# Patient Record
Sex: Female | Born: 1970
Health system: Southern US, Community
[De-identification: ages and names within clinical notes are randomized; demographics above are authoritative.]

## PROBLEM LIST (undated history)

## (undated) DIAGNOSIS — F32A Depression, unspecified: Secondary | ICD-10-CM

## (undated) DIAGNOSIS — F419 Anxiety disorder, unspecified: Secondary | ICD-10-CM

## (undated) HISTORY — DX: Depression, unspecified: F32.A

## (undated) HISTORY — DX: Anxiety disorder, unspecified: F41.9

## (undated) HISTORY — PX: COLONOSCOPY: SHX174

---

## 1997-04-10 HISTORY — PX: OTHER SURGICAL HISTORY: SHX169

## 2001-01-04 ENCOUNTER — Emergency Department (HOSPITAL_COMMUNITY): Admission: EM | Admit: 2001-01-04 | Discharge: 2001-01-04 | Payer: Self-pay | Admitting: Emergency Medicine

## 2008-04-10 HISTORY — PX: BREAST ENHANCEMENT SURGERY: SHX7

## 2019-03-13 DIAGNOSIS — F4320 Adjustment disorder, unspecified: Secondary | ICD-10-CM | POA: Diagnosis not present

## 2019-03-27 DIAGNOSIS — F4320 Adjustment disorder, unspecified: Secondary | ICD-10-CM | POA: Diagnosis not present

## 2019-05-27 DIAGNOSIS — F331 Major depressive disorder, recurrent, moderate: Secondary | ICD-10-CM | POA: Diagnosis not present

## 2019-05-27 DIAGNOSIS — R41840 Attention and concentration deficit: Secondary | ICD-10-CM | POA: Diagnosis not present

## 2019-05-27 DIAGNOSIS — Z800 Family history of malignant neoplasm of digestive organs: Secondary | ICD-10-CM | POA: Diagnosis not present

## 2019-05-27 DIAGNOSIS — F419 Anxiety disorder, unspecified: Secondary | ICD-10-CM | POA: Diagnosis not present

## 2019-06-04 DIAGNOSIS — F909 Attention-deficit hyperactivity disorder, unspecified type: Secondary | ICD-10-CM | POA: Diagnosis not present

## 2019-07-07 DIAGNOSIS — F909 Attention-deficit hyperactivity disorder, unspecified type: Secondary | ICD-10-CM | POA: Diagnosis not present

## 2019-07-10 DIAGNOSIS — F4320 Adjustment disorder, unspecified: Secondary | ICD-10-CM | POA: Diagnosis not present

## 2020-04-15 DIAGNOSIS — F909 Attention-deficit hyperactivity disorder, unspecified type: Secondary | ICD-10-CM | POA: Diagnosis not present

## 2020-05-12 DIAGNOSIS — Z800 Family history of malignant neoplasm of digestive organs: Secondary | ICD-10-CM | POA: Diagnosis not present

## 2020-05-12 DIAGNOSIS — B009 Herpesviral infection, unspecified: Secondary | ICD-10-CM | POA: Diagnosis not present

## 2020-05-12 DIAGNOSIS — Z0000 Encounter for general adult medical examination without abnormal findings: Secondary | ICD-10-CM | POA: Diagnosis not present

## 2020-05-12 DIAGNOSIS — Z8249 Family history of ischemic heart disease and other diseases of the circulatory system: Secondary | ICD-10-CM | POA: Diagnosis not present

## 2020-07-05 DIAGNOSIS — F909 Attention-deficit hyperactivity disorder, unspecified type: Secondary | ICD-10-CM | POA: Diagnosis not present

## 2020-07-20 DIAGNOSIS — Z13220 Encounter for screening for lipoid disorders: Secondary | ICD-10-CM | POA: Diagnosis not present

## 2020-07-20 DIAGNOSIS — F900 Attention-deficit hyperactivity disorder, predominantly inattentive type: Secondary | ICD-10-CM | POA: Diagnosis not present

## 2020-07-20 DIAGNOSIS — Z0000 Encounter for general adult medical examination without abnormal findings: Secondary | ICD-10-CM | POA: Diagnosis not present

## 2020-08-26 DIAGNOSIS — Z20822 Contact with and (suspected) exposure to covid-19: Secondary | ICD-10-CM | POA: Diagnosis not present

## 2020-09-16 ENCOUNTER — Emergency Department (HOSPITAL_COMMUNITY): Payer: BC Managed Care – PPO

## 2020-09-16 ENCOUNTER — Emergency Department (HOSPITAL_COMMUNITY)
Admission: EM | Admit: 2020-09-16 | Discharge: 2020-09-17 | Disposition: A | Discharge status: Home / Self Care | Payer: BC Managed Care – PPO | Attending: Emergency Medicine | Admitting: Emergency Medicine

## 2020-09-16 ENCOUNTER — Other Ambulatory Visit: Payer: Self-pay

## 2020-09-16 ENCOUNTER — Encounter (HOSPITAL_COMMUNITY): Payer: Self-pay | Admitting: Emergency Medicine

## 2020-09-16 DIAGNOSIS — Z8616 Personal history of COVID-19: Secondary | ICD-10-CM | POA: Insufficient documentation

## 2020-09-16 DIAGNOSIS — R079 Chest pain, unspecified: Secondary | ICD-10-CM | POA: Diagnosis not present

## 2020-09-16 DIAGNOSIS — R072 Precordial pain: Secondary | ICD-10-CM | POA: Diagnosis not present

## 2020-09-16 DIAGNOSIS — R091 Pleurisy: Secondary | ICD-10-CM

## 2020-09-16 LAB — BASIC METABOLIC PANEL
Anion gap: 7 (ref 5–15)
BUN: 13 mg/dL (ref 6–20)
CO2: 27 mmol/L (ref 22–32)
Calcium: 9 mg/dL (ref 8.9–10.3)
Chloride: 102 mmol/L (ref 98–111)
Creatinine, Ser: 0.65 mg/dL (ref 0.44–1.00)
GFR, Estimated: >60 mL/min (ref >60)
Glucose, Bld: 115 mg/dL — ABNORMAL HIGH (ref 70–99)
Potassium: 3.8 mmol/L (ref 3.5–5.1)
Sodium: 136 mmol/L (ref 135–145)

## 2020-09-16 LAB — CBC
HCT: 37.8 % (ref 36.0–46.0)
Hemoglobin: 12.7 g/dL (ref 12.0–15.0)
MCH: 33.1 pg (ref 26.0–34.0)
MCHC: 33.6 g/dL (ref 30.0–36.0)
MCV: 98.4 fL (ref 80.0–100.0)
Platelets: 205 10*3/uL (ref 150–400)
RBC: 3.84 MIL/uL — ABNORMAL LOW (ref 3.87–5.11)
RDW: 12.5 % (ref 11.5–15.5)
WBC: 6.5 10*3/uL (ref 4.0–10.5)
nRBC: 0 % (ref 0.0–0.2)

## 2020-09-16 LAB — TROPONIN I (HIGH SENSITIVITY)
Troponin I (High Sensitivity): <2 ng/L (ref <18)
Troponin I (High Sensitivity): <2 ng/L (ref <18)

## 2020-09-16 MED ORDER — IOHEXOL 300 MG/ML  SOLN: 100.0000 mL | Freq: Once | INTRAMUSCULAR | Status: DC | PRN

## 2020-09-16 MED ORDER — PANTOPRAZOLE SODIUM 40 MG IV SOLR
40.0000 mg | Freq: Once | INTRAVENOUS | Status: AC
  Administered 2020-09-16: 40 mg via INTRAVENOUS
  Filled 2020-09-16: qty 40

## 2020-09-16 MED ORDER — PANTOPRAZOLE SODIUM 20 MG PO TBEC: 20.0000 mg | DELAYED_RELEASE_TABLET | Freq: Every day | ORAL | 0 refills | Status: DC

## 2020-09-16 MED ORDER — IOHEXOL 350 MG/ML SOLN
75.0000 mL | Freq: Once | INTRAVENOUS | Status: AC | PRN
  Administered 2020-09-16: 75 mL via INTRAVENOUS

## 2020-09-16 NOTE — ED Triage Notes (Signed)
Per pt, states CP and left arm pain for several weeks-comes and goes-states she takes Adderall but has not taken in 3 days because she wanted to make sure med was not causing symptoms-states PCP told her to come to ED

## 2020-09-16 NOTE — Discharge Instructions (Addendum)
1.  Follow-up with your doctor for recheck.  Discuss follow-up echocardiogram for further evaluation. 2.  Start taking Protonix daily for the next 2 weeks.  Take acetaminophen extra strength every 6 hours as needed for pain 3.  Return to emergency department for develop a fever, lightheadedness, worsening pain or other concerning symptoms.

## 2020-09-16 NOTE — ED Provider Notes (Signed)
Emergency Medicine Provider Triage Evaluation Note  Jessica Avila , a 50 y.o. female  was evaluated in triage.  Pt complains of chest pain for the last 3 weeks. Pain is constant and is described as a tightness. Pain radiates to the lle. Reports associated palpitations. Reports intermittent sob.  States she had covid a few weeks ago.  Review of Systems  Positive: Chest pain, sob Negative: cough  Physical Exam  BP (!) 142/83 (BP Location: Left Arm)   Pulse 72   Temp 98.3 F (36.8 C)   Resp 16   SpO2 99%  Gen:   Awake, no distress   Resp:  Normal effort  MSK:   Moves extremities without difficulty  Other:  Heart with rrr, lungs ctab  Medical Decision Making  Medically screening exam initiated at 1:22 PM.  Appropriate orders placed.  Jessica Avila was informed that the remainder of the evaluation will be completed by another provider, this initial triage assessment does not replace that evaluation, and the importance of remaining in the ED until their evaluation is complete.     Karrie Meres, PA-C 09/16/20 1325    Vanetta Mulders, MD 09/22/20 [phone]

## 2020-09-16 NOTE — ED Provider Notes (Signed)
[facility]-EMERGENCY DEPT Provider Note   CSN: [id number] Arrival date & time: 09/16/20  1256     History Chief Complaint  Patient presents with   Chest Pain    Jessica Avila is a 50 y.o. female.  HPI Patient reports for approximately 3 weeks she has been getting chest pain.  Sometimes it has a grabbing and sharp quality.  It occurs in her center chest and radiates sometimes more to the left and others more to the right.  Patient sometimes is experiencing achy pain into the left arm.  Pain does also sometimes radiate into the back.  Patient had COVID about a month ago and had several days of severe fatigue and body aches but recovered without other problems.    History reviewed. No pertinent past medical history.  There are no problems to display for this patient.   History reviewed. No pertinent surgical history.   OB History   No obstetric history on file.     No family history on file.     Home Medications Prior to Admission medications   Medication Sig Start Date End Date Taking? Authorizing Provider  pantoprazole (PROTONIX) 20 MG tablet Take 1 tablet (20 mg total) by mouth daily. 09/16/20  Yes Arby Barrette, MD    Allergies    Patient has no allergy information on record.  Review of Systems   Review of Systems 10 systems reviewed and negative except as per HPI Physical Exam Updated Vital Signs BP 122/86   Pulse (!) 56   Temp 98.3 F (36.8 C)   Resp 17   SpO2 98%   Physical Exam Constitutional:      Appearance: Normal appearance. She is well-developed.  HENT:     Head: Normocephalic and atraumatic.     Mouth/Throat:     Mouth: Mucous membranes are moist.     Pharynx: Oropharynx is clear.  Eyes:     Extraocular Movements: Extraocular movements intact.     Conjunctiva/sclera: Conjunctivae normal.  Cardiovascular:     Rate and Rhythm: Normal rate and regular rhythm.     Heart sounds: Normal heart sounds.  Pulmonary:      Effort: Pulmonary effort is normal.     Breath sounds: Normal breath sounds.  Abdominal:     General: Bowel sounds are normal. There is no distension.     Palpations: Abdomen is soft.     Tenderness: There is no abdominal tenderness.  Musculoskeletal:        General: Normal range of motion.     Cervical back: Neck supple.     Right lower leg: No edema.     Left lower leg: No edema.  Skin:    General: Skin is warm and dry.  Neurological:     Mental Status: She is alert and oriented to person, place, and time.     GCS: GCS eye subscore is 4. GCS verbal subscore is 5. GCS motor subscore is 6.     Coordination: Coordination normal.  Psychiatric:        Mood and Affect: Mood normal.    ED Results / Procedures / Treatments   Labs (all labs ordered are listed, but only abnormal results are displayed) Labs Reviewed  BASIC METABOLIC PANEL - Abnormal; Notable for the following components:      Result Value   Glucose, Bld 115 (*)    All other components within normal limits  CBC - Abnormal; Notable for the following components:  RBC 3.84 (*)    All other components within normal limits  TROPONIN I (HIGH SENSITIVITY)  TROPONIN I (HIGH SENSITIVITY)    EKG EKG Interpretation  Date/Time:  Thursday September 16 2020 13:13:43 EDT Ventricular Rate:  73 PR Interval:  120 QRS Duration: 96 QT Interval:  406 QTC Calculation: 448 R Axis:   54 Text Interpretation: Sinus rhythm Baseline wander in lead(s) V1 agree, no acute ischemic changes Confirmed by Arby Barrette [phone]) on 09/16/2020 7:33:06 PM  Radiology DG Chest 2 View  Result Date: 09/16/2020 CLINICAL DATA:  Chest pain and left arm pain for several weeks EXAM: CHEST - 2 VIEW COMPARISON:  None. FINDINGS: The heart size and mediastinal contours are within normal limits. No focal airspace disease. No pleural effusion or pneumothorax. No acute osseous abnormality. Straightening of the thoracic spine. IMPRESSION: No evidence of acute  cardiopulmonary disease. Electronically Signed   By: Caprice Renshaw   On: 09/16/2020 14:06    Procedures Procedures   Medications Ordered in ED Medications  iohexol (OMNIPAQUE) 300 MG/ML solution 100 mL (has no administration in time range)  pantoprazole (PROTONIX) injection 40 mg (has no administration in time range)    ED Course  I have reviewed the triage vital signs and the nursing notes.  Pertinent labs & imaging results that were available during my care of the patient were reviewed by me and considered in my medical decision making (see chart for details).    MDM Rules/Calculators/A&P                         Patient presents with chest pain that is central but also is having sharp and variable location.  She had COVID about 4 weeks ago.  At this time we will proceed with CT PE study to rule out PE.  No murmurs present on exam.  Patient does have pain that radiates to the arm.  2 troponins are negative and EKG is nonischemic.  At this time I do feel patient will be stable from cardiac perspective to follow-up as outpatient and discuss follow-up echocardiogram with her PCP.  Recommend starting Protonix for 2 weeks.  If CT scan within normal limits anticipate discharge.  Final Clinical Impression(s) / ED Diagnoses Final diagnoses:  Precordial chest pain    Rx / DC Orders ED Discharge Orders          Ordered    pantoprazole (PROTONIX) 20 MG tablet  Daily        09/16/20 2231             Arby Barrette, MD 09/16/20 2233

## 2020-10-04 DIAGNOSIS — F909 Attention-deficit hyperactivity disorder, unspecified type: Secondary | ICD-10-CM | POA: Diagnosis not present

## 2020-10-27 DIAGNOSIS — F909 Attention-deficit hyperactivity disorder, unspecified type: Secondary | ICD-10-CM | POA: Diagnosis not present

## 2020-11-18 DIAGNOSIS — F909 Attention-deficit hyperactivity disorder, unspecified type: Secondary | ICD-10-CM | POA: Diagnosis not present

## 2020-12-20 DIAGNOSIS — F909 Attention-deficit hyperactivity disorder, unspecified type: Secondary | ICD-10-CM | POA: Diagnosis not present

## 2021-03-21 DIAGNOSIS — F909 Attention-deficit hyperactivity disorder, unspecified type: Secondary | ICD-10-CM | POA: Diagnosis not present

## 2022-05-25 ENCOUNTER — Other Ambulatory Visit (HOSPITAL_BASED_OUTPATIENT_CLINIC_OR_DEPARTMENT_OTHER): Payer: Self-pay | Admitting: Registered Nurse

## 2022-05-25 ENCOUNTER — Other Ambulatory Visit: Payer: Self-pay | Admitting: Registered Nurse

## 2022-05-25 DIAGNOSIS — Z8249 Family history of ischemic heart disease and other diseases of the circulatory system: Secondary | ICD-10-CM

## 2022-05-25 DIAGNOSIS — Z1231 Encounter for screening mammogram for malignant neoplasm of breast: Secondary | ICD-10-CM

## 2022-07-07 ENCOUNTER — Ambulatory Visit
Admission: RE | Admit: 2022-07-07 | Discharge: 2022-07-07 | Disposition: A | Discharge status: Home / Self Care | Payer: Medicaid Other | Source: Ambulatory Visit | Attending: Registered Nurse | Admitting: Registered Nurse

## 2022-07-07 DIAGNOSIS — Z1231 Encounter for screening mammogram for malignant neoplasm of breast: Secondary | ICD-10-CM

## 2022-08-25 ENCOUNTER — Ambulatory Visit (HOSPITAL_BASED_OUTPATIENT_CLINIC_OR_DEPARTMENT_OTHER)
Admission: RE | Admit: 2022-08-25 | Discharge: 2022-08-25 | Disposition: A | Discharge status: Home / Self Care | Payer: Medicaid Other | Source: Ambulatory Visit | Attending: Registered Nurse | Admitting: Registered Nurse

## 2022-08-25 DIAGNOSIS — Z8249 Family history of ischemic heart disease and other diseases of the circulatory system: Secondary | ICD-10-CM | POA: Insufficient documentation

## 2022-09-10 IMAGING — CT CT ANGIO CHEST
2 of 6 series · 18 of 36 positions shown · IV contrast (omnipaque)
Comparison: Chest x-ray 09/16/2020

CLINICAL DATA: Chest pain.  Left arm pain.

EXAM:
CT ANGIOGRAPHY CHEST WITH CONTRAST
TECHNIQUE: Multidetector CT imaging of the chest was performed using the
standard protocol during bolus administration of intravenous
contrast. Multiplanar CT image reconstructions and MIPs were
obtained to evaluate the vascular anatomy.
CONTRAST:  75mL OMNIPAQUE IOHEXOL 350 MG/ML SOLN

[Series 5: thins · axial · 0.65mm/px · z∈[+1425,+1691]mm · 17 of 300 slices shown]
[im 17/300  lung]
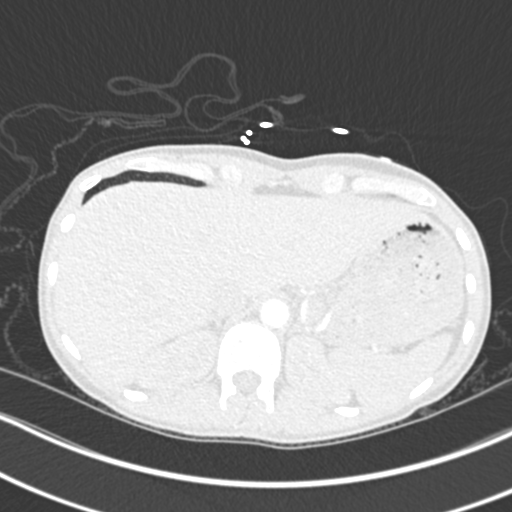
[im 34/300  mediastinal]
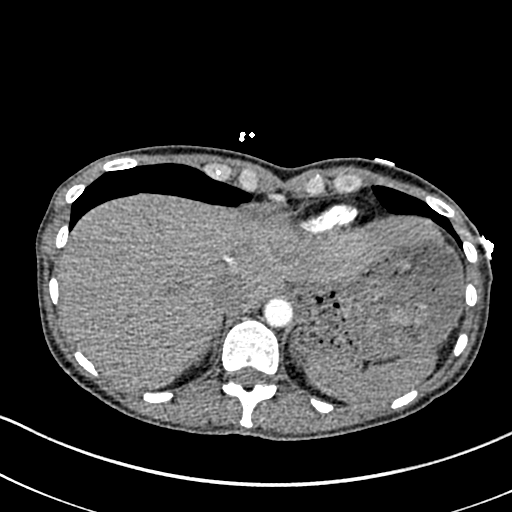
[im 50/300  lung]
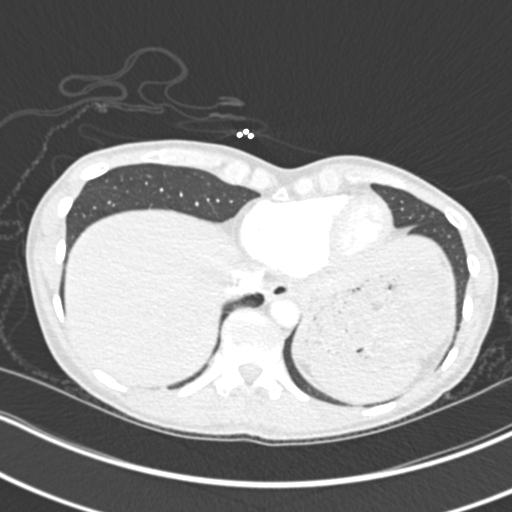
[im 67/300  mediastinal]
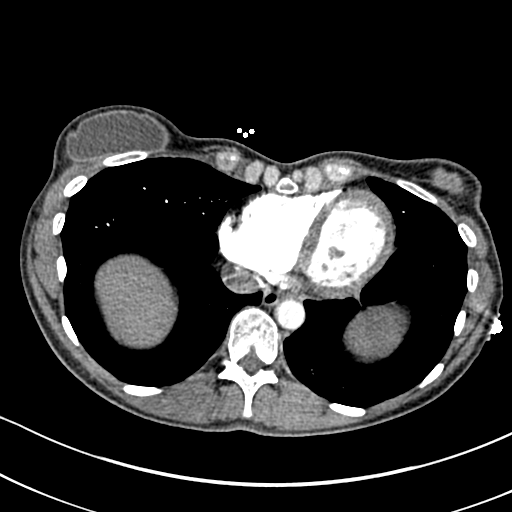
[im 84/300  lung]
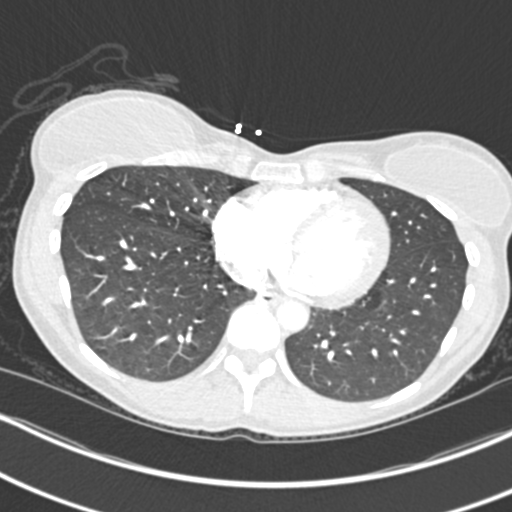
[im 100/300  mediastinal]
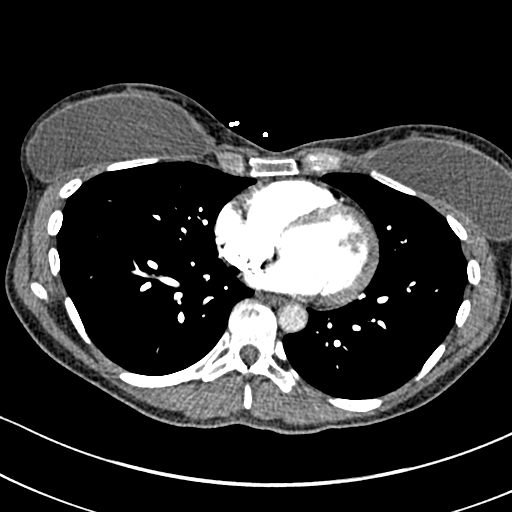
[im 117/300  lung]
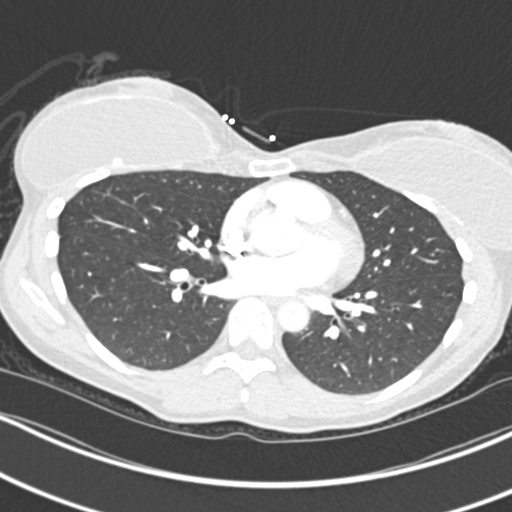
[im 133/300  mediastinal]
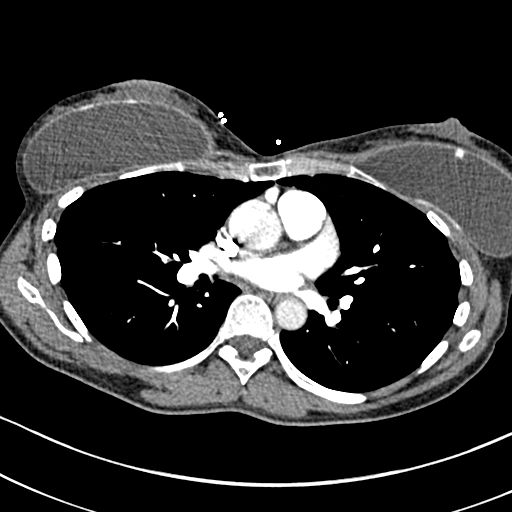
[im 150/300  lung]
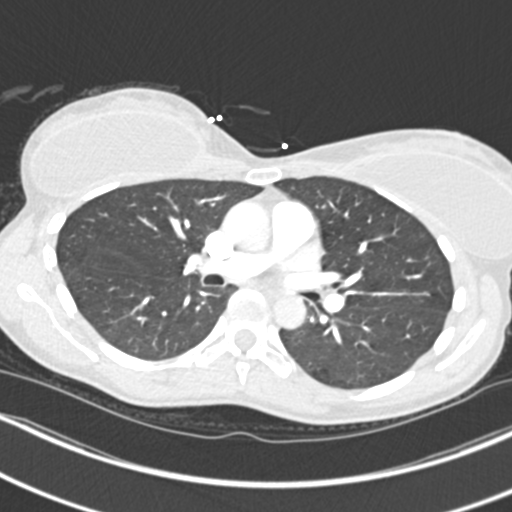
[im 167/300  mediastinal]
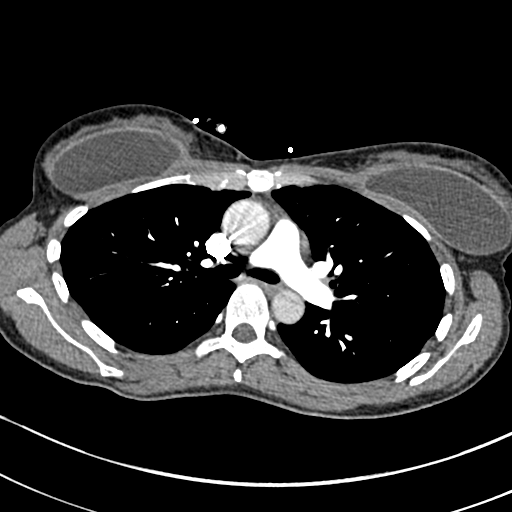
[im 183/300  lung]
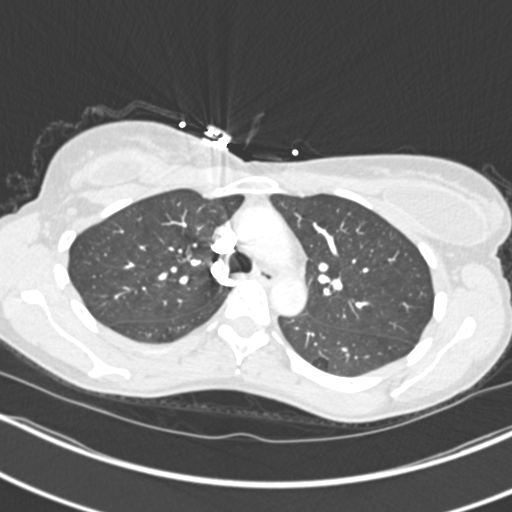
[im 200/300  mediastinal]
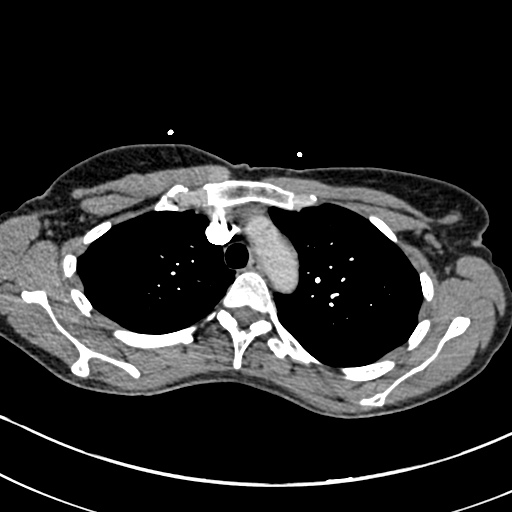
[im 216/300  lung]
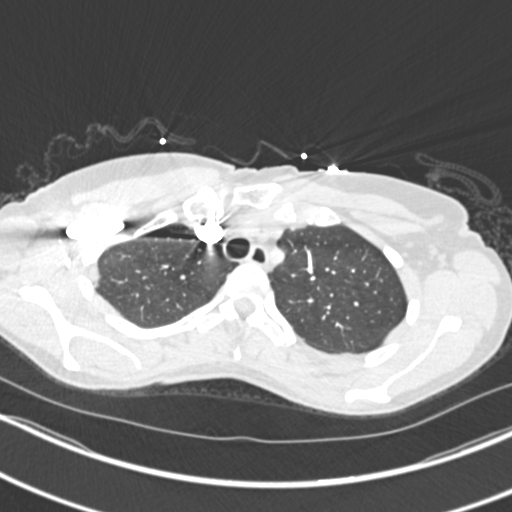
[im 233/300  mediastinal]
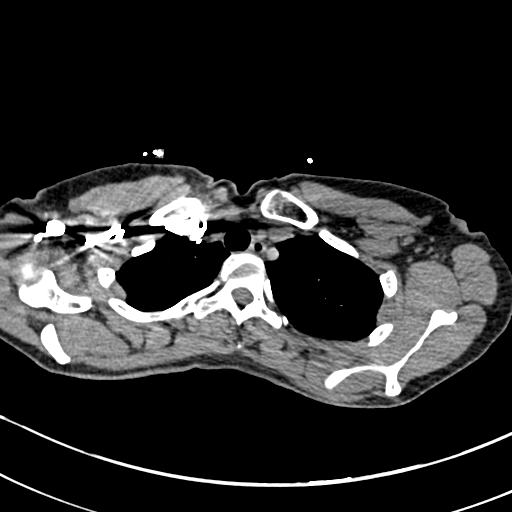
[im 250/300  lung]
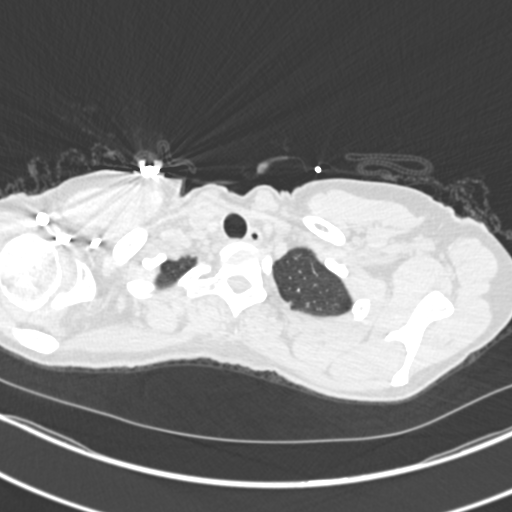
[im 266/300  mediastinal]
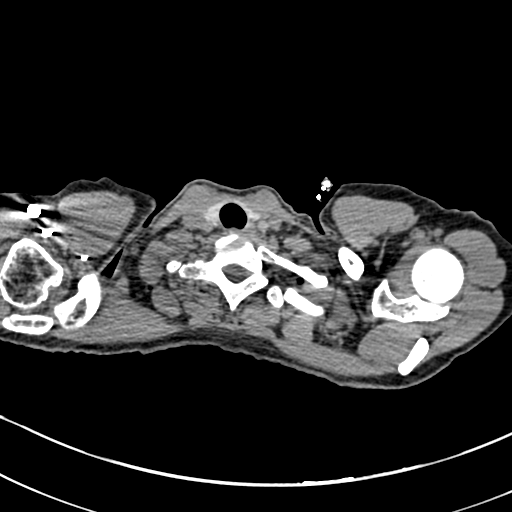
[im 283/300  lung]
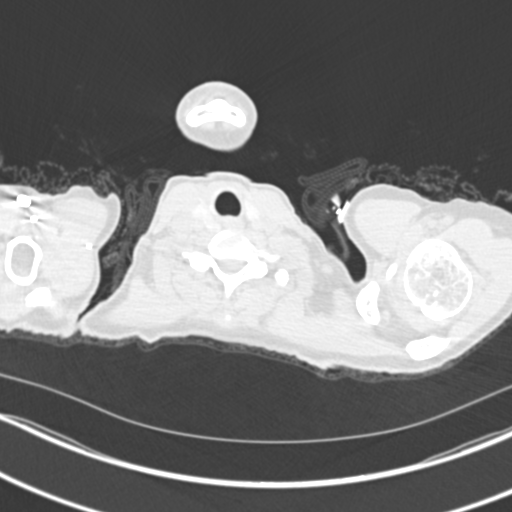

[Series 7: coronal mpr · coronal · 0.59mm/px · 1 of 106 slices shown]
[im 53/106  mediastinal]
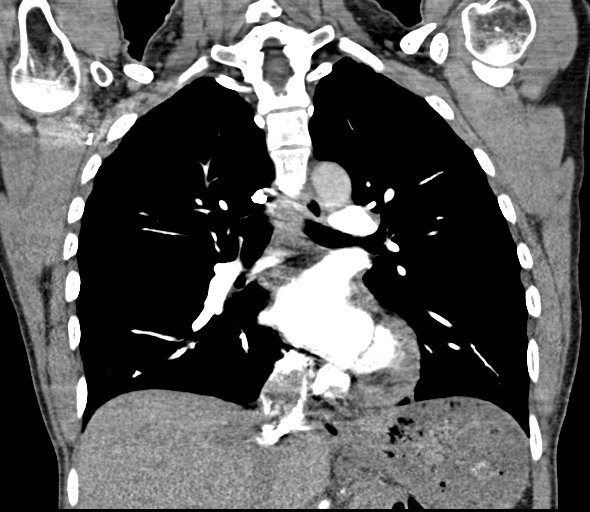

[18 of 36 positions shown; findings below may reference images not displayed]

FINDINGS: Cardiovascular: Satisfactory opacification of the pulmonary arteries
to the segmental level. No evidence of pulmonary embolism. Normal
heart size. No pericardial effusion.

Mediastinum/Nodes: No enlarged mediastinal, hilar, or axillary lymph
nodes. Thyroid gland, trachea, and esophagus demonstrate no
significant findings.

Lungs/Pleura: No pulmonary nodule. No pulmonary mass. No focal
consolidation. No pleural effusion. No pneumothorax. Thin walled
subpleural cystic lesions along bilateral lower lobes likely benign
in etiology.

Upper Abdomen: No acute abnormality.

Musculoskeletal: Bilateral breast implants. no chest wall
abnormality.

No suspicious lytic or blastic osseous lesions. No acute displaced
fracture.

Review of the MIP images confirms the above findings.
IMPRESSION: 1. No pulmonary embolus.
2. No acute intrathoracic abnormality.

## 2022-11-24 ENCOUNTER — Telehealth: Payer: Self-pay | Admitting: Gastroenterology

## 2022-11-24 NOTE — Telephone Encounter (Signed)
Called patient to advise we received previous GI records, also need to ask reason for transfer. Left voicemail.

## 2023-01-03 ENCOUNTER — Telehealth: Payer: Self-pay | Admitting: Gastroenterology

## 2023-01-03 NOTE — Telephone Encounter (Signed)
Thank you for the note.  I will review records when I can and send a follow-up note.  -HD

## 2023-01-03 NOTE — Telephone Encounter (Signed)
Good morning Dr. Myrtie Neither,  Supervising Provider: 01/03/23-AM   Patient called to request a transfer of care over to  GI, she stated she relocated from [facility] and is now in [place] seeking to establish care to get a colonoscopy done that she is due for. Has family hx of colon cancer from her mother. Her last colonoscopy was performed in 2017. Records were obtained for you to review and advise on scheduling.   Thank you

## 2023-01-12 NOTE — Telephone Encounter (Signed)
Records reviewed  (For documentation purposes-colonoscopy December 2017 by Dr. Carolan Shiver at [facility]. Diminutive cecal polypoid tissue removed-pathology shows normal colon tissue.)  She is due for screening colonoscopy due to a family history of colon cancer.  This can be directly booked with me in the [facility].  Please contact the patient and help make the arrangements.  Ellwood Dense, MD

## 2023-01-18 ENCOUNTER — Encounter: Payer: Self-pay | Admitting: Gastroenterology

## 2023-02-01 ENCOUNTER — Ambulatory Visit: Payer: Medicaid Other

## 2023-02-01 VITALS — Ht 66.5 in | Wt 134.0 lb

## 2023-02-01 DIAGNOSIS — Z800 Family history of malignant neoplasm of digestive organs: Secondary | ICD-10-CM

## 2023-02-01 MED ORDER — PEG 3350-KCL-NA BICARB-NACL 420 G PO SOLR: 4000.0000 mL | Freq: Once | ORAL | 0 refills | Status: AC

## 2023-02-01 NOTE — Progress Notes (Signed)

## 2023-02-12 ENCOUNTER — Encounter: Payer: Medicaid Other | Admitting: Gastroenterology

## 2023-02-21 ENCOUNTER — Encounter: Payer: Self-pay | Admitting: Gastroenterology

## 2023-02-21 ENCOUNTER — Ambulatory Visit: Payer: Medicaid Other | Admitting: Gastroenterology

## 2023-02-21 VITALS — BP 128/74 | HR 58 | Temp 97.8°F | Resp 12 | Ht 65.5 in | Wt 134.0 lb

## 2023-02-21 DIAGNOSIS — Z1211 Encounter for screening for malignant neoplasm of colon: Secondary | ICD-10-CM | POA: Diagnosis not present

## 2023-02-21 DIAGNOSIS — Z800 Family history of malignant neoplasm of digestive organs: Secondary | ICD-10-CM | POA: Diagnosis not present

## 2023-02-21 MED ORDER — SODIUM CHLORIDE 0.9 % IV SOLN: 500.0000 mL | INTRAVENOUS | Status: DC

## 2023-02-21 NOTE — Progress Notes (Signed)
Cell phone off per pt Pt's states no medical or surgical changes since previsit or office visit.  

## 2023-02-21 NOTE — Progress Notes (Signed)
History and Physical:  This patient presents for endoscopic testing for: Encounter Diagnosis  Name Primary?   Family history of malignant neoplasm of gastrointestinal tract Yes    Mother had CRC - this is the patient's second screening colonoscopy.  Records on file - Dec 2017 colonoscopy at [facility] - diminutive cecal polypoid tissue - normal pathology.  Patient denies chronic abdominal pain, rectal bleeding, constipation or diarrhea.   Patient is otherwise without complaints or active issues today.   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     Omega-3 Fatty Acids (FISH OIL) 300 MG CAPS Take by mouth.     TURMERIC PO Take by mouth.     Vitamin D, Ergocalciferol, (DRISDOL) 1.25 MG (50000 UNIT) CAPS capsule Take 50,000 Units by mouth once a week.     valACYclovir (VALTREX) 1000 MG tablet Take 1,000 mg by mouth daily as needed. (Patient not taking: Reported on 02/01/2023)     Current Facility-Administered Medications  Medication Dose Route Frequency Provider Last Rate Last Admin   0.9 %  sodium chloride infusion  500 mL Intravenous Continuous Danis, Starr Lake III, MD          ___________________________________________________________________ Objective   Exam:  BP 136/65   Pulse 79   Temp 97.8 F (36.6 C) (Skin)   Ht 5' 5.5" (1.664 m)   Wt 134 lb (60.8 kg)   SpO2 99%   BMI 21.96 kg/m   CV: regular , S1/S2 Resp: clear to auscultation  bilaterally, normal RR and effort noted GI: soft, no tenderness, with active bowel sounds.   Assessment: Encounter Diagnosis  Name Primary?   Family history of malignant neoplasm of gastrointestinal tract Yes     Plan: Colonoscopy   The benefits and risks of the planned procedure were described in detail with the patient or (when appropriate) their health care proxy.  Risks were outlined as including, but not limited to, bleeding, infection, perforation, adverse medication reaction leading to cardiac or pulmonary decompensation, pancreatitis (if ERCP).  The limitation of incomplete mucosal visualization was also discussed.  No guarantees or warranties were given.  The patient is appropriate for an endoscopic procedure in the ambulatory setting.   - Amada Jupiter, MD

## 2023-02-21 NOTE — Patient Instructions (Signed)
Resume previous diet.  Continue present medications. Repeat colonoscopy in 5 years for screening purposes.    YOU HAD AN ENDOSCOPIC PROCEDURE TODAY AT THE [facility]:   Refer to the procedure report that was given to you for any specific questions about what was found during the examination.  If the procedure report does not answer your questions, please call your gastroenterologist to clarify.  If you requested that your care partner not be given the details of your procedure findings, then the procedure report has been included in a sealed envelope for you to review at your convenience later.  YOU SHOULD EXPECT: Some feelings of bloating in the abdomen. Passage of more gas than usual.  Walking can help get rid of the air that was put into your GI tract during the procedure and reduce the bloating. If you had a lower endoscopy (such as a colonoscopy or flexible sigmoidoscopy) you may notice spotting of blood in your stool or on the toilet paper. If you underwent a bowel prep for your procedure, you may not have a normal bowel movement for a few days.  Please Note:  You might notice some irritation and congestion in your nose or some drainage.  This is from the oxygen used during your procedure.  There is no need for concern and it should clear up in a day or so.  SYMPTOMS TO REPORT IMMEDIATELY:  Following lower endoscopy (colonoscopy or flexible sigmoidoscopy):  Excessive amounts of blood in the stool  Significant tenderness or worsening of abdominal pains  Swelling of the abdomen that is new, acute  Fever of 100F or higher   For urgent or emergent issues, a gastroenterologist can be reached at any hour by calling [phone]. Do not use MyChart messaging for urgent concerns.    DIET:  We do recommend a small meal at first, but then you may proceed to your regular diet.  Drink plenty of fluids but you should avoid alcoholic beverages for 24 hours.  ACTIVITY:  You should  plan to take it easy for the rest of today and you should NOT DRIVE or use heavy machinery until tomorrow (because of the sedation medicines used during the test).    FOLLOW UP: Our staff will call the number listed on your records the next business day following your procedure.  We will call around 7:15- 8:00 am to check on you and address any questions or concerns that you may have regarding the information given to you following your procedure. If we do not reach you, we will leave a message.     If any biopsies were taken you will be contacted by phone or by letter within the next 1-3 weeks.  Please call us at [phone] if you have not heard about the biopsies in 3 weeks.    SIGNATURES/CONFIDENTIALITY: You and/or your care partner have signed paperwork which will be entered into your electronic medical record.  These signatures attest to the fact that that the information above on your After Visit Summary has been reviewed and is understood.  Full responsibility of the confidentiality of this discharge information lies with you and/or your care-partner.

## 2023-02-21 NOTE — Op Note (Signed)
[facility] Patient Name: Jessica Avila Procedure Date: 02/21/2023 2:12 PM MRN: [id number] Endoscopist: Sherilyn Cooter L. Myrtie Neither , MD, [id number] Age: 52 Referring MD:  Date of Birth: Nov 08, 1970 Gender: Female Account #: [id number] Procedure:                Colonoscopy Indications:              Screening in patient at increased risk: Colorectal                            cancer in mother 64 or older                           no adenomatous or serrated polyps on 2017                            colonoscopy at outside clinic Medicines:                Monitored Anesthesia Care Procedure:                Pre-Anesthesia Assessment:                           - Prior to the procedure, a History and Physical                            was performed, and patient medications and                            allergies were reviewed. The patient's tolerance of                            previous anesthesia was also reviewed. The risks                            and benefits of the procedure and the sedation                            options and risks were discussed with the patient.                            All questions were answered, and informed consent                            was obtained. Prior Anticoagulants: The patient has                            taken no anticoagulant or antiplatelet agents. ASA                            Grade Assessment: II - A patient with mild systemic                            disease. After reviewing the risks and benefits,  the patient was deemed in satisfactory condition to                            undergo the procedure.                           After obtaining informed consent, the colonoscope                            was passed under direct vision. Throughout the                            procedure, the patient's blood pressure, pulse, and                            oxygen saturations were monitored continuously. The                             CF [id number] #[id number] was introduced through the anus                            and advanced to the the cecum, identified by                            appendiceal orifice and ileocecal valve. The                            colonoscopy was somewhat difficult due to a                            redundant colon. Successful completion of the                            procedure was aided by using manual pressure and                            straightening and shortening the scope to obtain                            bowel loop reduction. The patient tolerated the                            procedure well. The quality of the bowel                            preparation was excellent. The ileocecal valve,                            appendiceal orifice, and rectum were photographed. Scope In: 2:27:22 PM Scope Out: 2:40:32 PM Scope Withdrawal Time: 0 hours 9 minutes 29 seconds  Total Procedure Duration: 0 hours 13 minutes 10 seconds  Findings:                 The perianal and digital rectal examinations were  normal.                           Repeat examination of right colon under NBI                            performed.                           The entire examined colon appeared normal on direct                            and retroflexion views. Complications:            No immediate complications. Estimated Blood Loss:     Estimated blood loss: none. Impression:               - The entire examined colon is normal on direct and                            retroflexion views.                           - No specimens collected. Recommendation:           - Patient has a contact number available for                            emergencies. The signs and symptoms of potential                            delayed complications were discussed with the                            patient. Return to normal activities tomorrow.                             Written discharge instructions were provided to the                            patient.                           - Resume previous diet.                           - Continue present medications.                           - Repeat colonoscopy in 5 years for screening                            purposes. Buel Molder L. Myrtie Neither, MD 02/21/2023 2:44:23 PM This report has been signed electronically.

## 2023-02-21 NOTE — Progress Notes (Signed)
To pacu, VSS. Report to [email or web address] 

## 2023-02-22 ENCOUNTER — Telehealth: Payer: Self-pay | Admitting: *Deleted

## 2023-02-22 NOTE — Telephone Encounter (Signed)
  Follow up Call-     02/21/2023    1:42 PM  Call back number  Post procedure Call Back phone  # [phone]  Permission to leave phone message Yes     Patient questions:  Message left to call us if necessary.
